# Patient Record
Sex: Male | Born: 1975 | Race: White | Hispanic: No | Marital: Married
Health system: Southern US, Community
[De-identification: ages and names within clinical notes are randomized; demographics above are authoritative.]

---

## 2001-08-04 ENCOUNTER — Emergency Department (HOSPITAL_COMMUNITY): Admission: EM | Admit: 2001-08-04 | Discharge: 2001-08-04 | Payer: Self-pay

## 2007-04-21 ENCOUNTER — Emergency Department: Payer: Self-pay | Admitting: Emergency Medicine

## 2008-05-03 ENCOUNTER — Emergency Department: Payer: Self-pay | Admitting: Emergency Medicine

## 2020-02-22 ENCOUNTER — Other Ambulatory Visit: Payer: Self-pay

## 2020-02-22 ENCOUNTER — Encounter: Payer: Self-pay | Admitting: Emergency Medicine

## 2020-02-22 ENCOUNTER — Emergency Department
Admission: EM | Admit: 2020-02-22 | Discharge: 2020-02-22 | Disposition: A | Discharge status: Home / Self Care | Payer: Self-pay | Attending: Emergency Medicine | Admitting: Emergency Medicine

## 2020-02-22 DIAGNOSIS — Y9289 Other specified places as the place of occurrence of the external cause: Secondary | ICD-10-CM | POA: Insufficient documentation

## 2020-02-22 DIAGNOSIS — Y9389 Activity, other specified: Secondary | ICD-10-CM | POA: Insufficient documentation

## 2020-02-22 DIAGNOSIS — K047 Periapical abscess without sinus: Secondary | ICD-10-CM | POA: Insufficient documentation

## 2020-02-22 DIAGNOSIS — S025XXA Fracture of tooth (traumatic), initial encounter for closed fracture: Secondary | ICD-10-CM | POA: Insufficient documentation

## 2020-02-22 DIAGNOSIS — K029 Dental caries, unspecified: Secondary | ICD-10-CM | POA: Insufficient documentation

## 2020-02-22 DIAGNOSIS — Y998 Other external cause status: Secondary | ICD-10-CM | POA: Insufficient documentation

## 2020-02-22 MED ORDER — LIDOCAINE VISCOUS HCL 2 % MT SOLN: 10.0000 mL | OROMUCOSAL | 0 refills | Status: DC | PRN

## 2020-02-22 MED ORDER — AMOXICILLIN-POT CLAVULANATE 875-125 MG PO TABS
1.0000 | ORAL_TABLET | Freq: Once | ORAL | Status: AC
  Administered 2020-02-22: 1 via ORAL
  Filled 2020-02-22: qty 1

## 2020-02-22 MED ORDER — HYDROCODONE-ACETAMINOPHEN 5-325 MG PO TABS
1.0000 | ORAL_TABLET | Freq: Once | ORAL | Status: AC
  Administered 2020-02-22: 1 via ORAL
  Filled 2020-02-22: qty 1

## 2020-02-22 MED ORDER — HYDROCODONE-ACETAMINOPHEN 5-325 MG PO TABS: 1.0000 | ORAL_TABLET | ORAL | 0 refills | Status: DC | PRN

## 2020-02-22 MED ORDER — AMOXICILLIN-POT CLAVULANATE 875-125 MG PO TABS: 1.0000 | ORAL_TABLET | Freq: Two times a day (BID) | ORAL | 0 refills | Status: DC

## 2020-02-22 NOTE — Discharge Instructions (Signed)
OPTIONS FOR DENTAL FOLLOW UP CARE ° °[place] Department of Health and Human Services - Local Safety Net Dental Clinics °http://www.ncdhhs.gov/dph/oralhealth/services/safetynetclinics.htm °  °[facility] (336-562-3123) ° °[facility] [place] ([phone]) ° °[facility] ([phone] ext [phone]) ° °[facility] ([phone]) ° °[facility] ([phone]) °This clinic caters to the indigent population and is on a lottery system. °Location: °[facility], [address], [place] °Clinic Hours: °Wednesdays from 6pm - 9pm, patients seen by a lottery system. °For dates, call or go to [email or web address].[facility].edu/[facility]/patients/Dental-[facility] °Services: °Cleanings, fillings and simple extractions. °Payment Options: °DENTAL WORK IS FREE OF CHARGE. Bring proof of income or support. °Best way to get seen: °Arrive at 5:15 pm - this is a lottery, NOT first come/first serve, so arriving earlier will not increase your chances of being seen. °  °  °[facility] °[phone] °Select option 1 for emergencies °  °Location: °[facility], [address], [place] °Clinic Hours: °No walk-ins accepted - call the day before to schedule an appointment. °Check in times are 9:30 am and 1:30 pm. °Services: °Simple extractions, temporary fillings, pulpectomy/pulp debridement, uncomplicated abscess drainage. °Payment Options: °PAYMENT IS DUE AT THE TIME OF SERVICE.  Fee is usually $100-200, additional surgical procedures (e.g. abscess drainage) may be extra. °Cash, checks, Visa/MasterCard accepted.  Can file Medicaid if patient is covered for dental - patient should call case worker to check. °No discount for [facility] patients. °Best way to get seen: °MUST call the day before and get onto the schedule. Can usually be seen the next 1-2 days. No walk-ins accepted. °  °  °[facility] °[phone] °   °Location: °[facility], [address], [place] °Clinic Hours: °M, W, Th, F 8am or 1:30pm, Tues 9a or 1:30 - first come/first served. °Services: °Simple extractions, temporary fillings, uncomplicated abscess drainage.  You do not need to be an [place] resident. °Payment Options: °PAYMENT IS DUE AT THE TIME OF SERVICE. °Dental insurance, otherwise sliding scale - bring proof of income or support. °Depending on income and treatment needed, cost is usually $50-200. °Best way to get seen: °Arrive early as it is first come/first served. °  °  °[facility] °[phone] °  °Location: °[address] °Clinic Hours: °Mon-Thu 8a-5p °Services: °Most basic dental services including extractions and fillings. °Payment Options: °PAYMENT IS DUE AT THE TIME OF SERVICE. °Sliding scale, up to 50% off - bring proof if income or support. °Medicaid with dental option accepted. °Best way to get seen: °Call to schedule an appointment, can usually be seen within 2 weeks OR they will try to see walk-ins - show up at 8a or 2p (you may have to wait). °  °  °[facility] °[phone] °[place] RESIDENTS ONLY °  °Location: °[facility], [address], [place], [place] [address] °Clinic Hours: By appointment only. °Monday - Thursday 8am-5pm, Friday 8am-12pm °Services: Cleanings, fillings, extractions. °Payment Options: °PAYMENT IS DUE AT THE TIME OF SERVICE. °Cash, Visa or MasterCard. Sliding scale - $30 minimum per service. °Best way to get seen: °Come in to office, complete packet and make an appointment - need proof of income °or support monies for each household member and proof of [place] residence. °Usually takes about a month to get in. °  °  °[facility] °[phone] °  °Location: °[address]   [place] °Clinic Hours: Walk-in Urgent Care Dental Services are offered Monday-Friday  mornings only. °The numbers of emergencies accepted daily is limited to the number of °providers available. °Maximum 15 - Mondays, Wednesdays & Thursdays °Maximum 10 - Tuesdays & Fridays °Services: °You do not need to be a [place] resident to be seen for a dental emergency. °Emergencies are defined as pain, swelling, abnormal bleeding, or dental trauma. Walkins will receive x-rays if needed. °NOTE: Dental cleaning is not an emergency. °Payment Options: °PAYMENT IS DUE AT THE TIME OF SERVICE. °Minimum co-pay is $40.00 for uninsured patients. °Minimum co-pay is $3.00 for Medicaid with dental coverage. °Dental Insurance is accepted and must be presented at time of visit. °Medicare does not cover dental. °Forms of payment: Cash, credit card, checks. °Best way to get seen: °If not previously registered with the clinic, walk-in dental registration begins at 7:15 am and is on a first come/first serve basis. °If previously registered with the clinic, call to make an appointment. °  °  °[facility] °[phone] °[place] RESIDENTS ONLY °  °Location: °[address], [place], [place] °Clinic Hours: °Mon-Thu 10a-2p °Services: Extractions only! °Payment Options: °FREE (donations accepted) - bring proof of income or support °Best way to get seen: °Call and schedule an appointment OR come at 8am on the 1st Monday of every month (except for holidays) when it is first come/first served. °  °  °[facility] °[phone] °  °Location: °[address], [place] °Clinic Hours: °Friday mornings °Services, Payment Options, Best way to get seen: °Call for info °
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
## 2020-02-22 NOTE — ED Notes (Signed)
Patient declined discharge vital signs. 

## 2020-02-22 NOTE — ED Triage Notes (Signed)
Pt reports toothache to right lower jaw for the past 2 days.

## 2020-02-22 NOTE — ED Triage Notes (Signed)
Pt cursing at staff in the [facility]. [employer] and security called out front to be present. Visitor restrictions explained to the pt, pt states, "I don't give a fuck, do something". RN apologetic to pt visitor. He continues to curse.

## 2020-11-15 ENCOUNTER — Other Ambulatory Visit: Payer: Self-pay

## 2020-11-15 ENCOUNTER — Encounter: Payer: Self-pay | Admitting: Emergency Medicine

## 2020-11-15 ENCOUNTER — Emergency Department: Payer: Self-pay

## 2020-11-15 ENCOUNTER — Emergency Department
Admission: EM | Admit: 2020-11-15 | Discharge: 2020-11-15 | Disposition: A | Discharge status: Home / Self Care | Payer: Self-pay | Attending: Emergency Medicine | Admitting: Emergency Medicine

## 2020-11-15 DIAGNOSIS — W450XXA Nail entering through skin, initial encounter: Secondary | ICD-10-CM | POA: Insufficient documentation

## 2020-11-15 DIAGNOSIS — Y9339 Activity, other involving climbing, rappelling and jumping off: Secondary | ICD-10-CM | POA: Insufficient documentation

## 2020-11-15 DIAGNOSIS — S51811A Laceration without foreign body of right forearm, initial encounter: Secondary | ICD-10-CM | POA: Insufficient documentation

## 2020-11-15 MED ORDER — OXYCODONE-ACETAMINOPHEN 5-325 MG PO TABS
1.0000 | ORAL_TABLET | Freq: Once | ORAL | Status: AC
  Administered 2020-11-15: 1 via ORAL
  Filled 2020-11-15: qty 1

## 2020-11-15 MED ORDER — BACITRACIN-NEOMYCIN-POLYMYXIN 400-5-5000 EX OINT
TOPICAL_OINTMENT | CUTANEOUS | Status: AC
  Filled 2020-11-15: qty 1

## 2020-11-15 MED ORDER — DOXYCYCLINE HYCLATE 100 MG PO CAPS: 100.0000 mg | ORAL_CAPSULE | Freq: Two times a day (BID) | ORAL | 0 refills | Status: AC

## 2020-11-15 MED ORDER — HYDROCODONE-ACETAMINOPHEN 5-325 MG PO TABS: 1.0000 | ORAL_TABLET | Freq: Four times a day (QID) | ORAL | 0 refills | Status: AC | PRN

## 2020-11-15 MED ORDER — LIDOCAINE-EPINEPHRINE 2 %-1:100000 IJ SOLN
30.0000 mL | Freq: Once | INTRAMUSCULAR | Status: DC
  Filled 2020-11-15: qty 2

## 2020-11-15 NOTE — ED Provider Notes (Signed)
[facility] Emergency Department Provider Note  ____________________________________________   Event Date/Time   First MD Initiated Contact with Patient 11/15/20 1339     (approximate)  I have reviewed the triage vital signs and the nursing notes.   HISTORY  Chief Complaint Laceration   HPI Brent Shea is a 45 y.o. male a past medical history of tobacco abuse who presents for assessment of a cut sustained to his right forearm last night.  Patient states he was getting off a deck when he excellently cut his forearm on an exposed nail.  Denies any other injuries states he applied superglue and some clots and a rope was able to get the bleeding stopped.  States his last tetanus shot was 2 years ago.  Denies any other pain including in the wrist, elbow or any associated sick symptoms including headache, earache, sore throat, fevers, chills, cough, nausea, vomiting, diarrhea, dysuria, rash or any other recent similar injuries.         History reviewed. No pertinent past medical history.  There are no problems to display for this patient.   History reviewed. No pertinent surgical history.  Prior to Admission medications   Medication Sig Start Date End Date Taking? Authorizing Provider  doxycycline (VIBRAMYCIN) 100 MG capsule Take 1 capsule (100 mg total) by mouth 2 (two) times daily for 7 days. 11/15/20 11/22/20 Yes Gilles Chiquito, MD  HYDROcodone-acetaminophen (NORCO) 5-325 MG tablet Take 1 tablet by mouth every 6 (six) hours as needed for up to 3 days for severe pain. 11/15/20 11/18/20 Yes Gilles Chiquito, MD    Allergies Patient has no known allergies.  No family history on file.  Social History    Review of Systems  Review of Systems  Constitutional: Negative for chills and fever.  HENT: Negative for sore throat.   Eyes: Negative for pain.  Respiratory: Negative for cough and stridor.   Cardiovascular: Negative for chest pain.   Gastrointestinal: Negative for vomiting.  Genitourinary: Negative for dysuria.  Musculoskeletal: Positive for myalgias ( R forearm).  Skin: Negative for rash.  Neurological: Negative for seizures, loss of consciousness and headaches.  Psychiatric/Behavioral: Negative for suicidal ideas.  All other systems reviewed and are negative.     ____________________________________________   PHYSICAL EXAM:  VITAL SIGNS: ED Triage Vitals  Enc Vitals Group     BP 11/15/20 1318 110/83     Pulse Rate 11/15/20 1318 84     Resp 11/15/20 1318 18     Temp 11/15/20 1318 98.4 F (36.9 C)     Temp Source 11/15/20 1318 Oral     SpO2 11/15/20 1318 97 %     Weight 11/15/20 1249 177 lb 14.6 oz (80.7 kg)     Height 11/15/20 1249 6\' 1"  (1.854 m)     Head Circumference --      Peak Flow --      Pain Score 11/15/20 1249 8     Pain Loc --      Pain Edu? --      Excl. in GC? --    Vitals:   11/15/20 1318  BP: 110/83  Pulse: 84  Resp: 18  Temp: 98.4 F (36.9 C)  SpO2: 97%   Physical Exam Vitals and nursing note reviewed.  Constitutional:      Appearance: He is well-developed.  HENT:     Head: Normocephalic and atraumatic.     Right Ear: External ear normal.     Left  Ear: External ear normal.     Nose: Nose normal.     Mouth/Throat:     Mouth: Mucous membranes are moist.  Eyes:     Conjunctiva/sclera: Conjunctivae normal.  Cardiovascular:     Rate and Rhythm: Normal rate and regular rhythm.     Heart sounds: No murmur heard.   Pulmonary:     Effort: Pulmonary effort is normal. No respiratory distress.     Breath sounds: Normal breath sounds.  Abdominal:     Palpations: Abdomen is soft.     Tenderness: There is no abdominal tenderness.  Musculoskeletal:     Cervical back: Neck supple.     Right lower leg: No edema.     Left lower leg: No edema.  Skin:    General: Skin is warm and dry.     Capillary Refill: Capillary refill takes less than 2 seconds.  Neurological:      Mental Status: He is alert and oriented to person, place, and time.  Psychiatric:        Mood and Affect: Mood normal.     Patient is at approximately V-shaped laceration over his right mid forearm.  There is underlying fascia and little tendon exposed.  Is able to flex and extend all digits in the right hand as well as the wrist.  Less than 2-second cap refill in all digits.  2+ radial pulse.  Sensation intact in the distribution of the radial ulnar and median nerves.  No other injuries noted to the hand palm wrist or upper arm. ____________________________________________   LABS (all labs ordered are listed, but only abnormal results are displayed)  Labs Reviewed - No data to display ____________________________________________  EKG  ____________________________________________  RADIOLOGY  ED MD interpretation: No fracture dislocation or retained foreign body.  Official radiology report(s): DG Forearm Right  Result Date: 11/15/2020 CLINICAL DATA:  Laceration due to nail EXAM: RIGHT FOREARM - 2 VIEW COMPARISON:  None. FINDINGS: Frontal and lateral views obtained. No radiopaque foreign body or soft tissue air. No fracture or dislocation. Joint spaces appear normal. There is a minus ulnar variance. IMPRESSION: No bony abnormality. No arthropathy. No radiopaque foreign body or soft tissue air. Minus ulnar variance. Electronically Signed   By: Bretta Bang III M.D.   On: 11/15/2020 14:25    ____________________________________________   PROCEDURES  Procedure(s) performed (including Critical Care):  Marland KitchenMarland KitchenLaceration Repair  Date/Time: 11/15/2020 2:31 PM Performed by: Gilles Chiquito, MD Authorized by: Gilles Chiquito, MD   Consent:    Consent obtained:  Verbal   Consent given by:  Patient   Risks, benefits, and alternatives were discussed: yes     Risks discussed:  Infection, pain, poor cosmetic result, need for additional repair, nerve damage, retained foreign body, tendon  damage and poor wound healing Universal protocol:    Procedure explained and questions answered to patient or proxy's satisfaction: yes     Patient identity confirmed:  Verbally with patient Laceration details:    Location:  Shoulder/arm   Shoulder/arm location:  R lower arm   Length (cm):  6 Exploration:    Limited defect created (wound extended): no     Hemostasis achieved with:  Direct pressure   Imaging obtained: x-ray     Imaging outcome: foreign body not noted     Wound exploration: wound explored through full range of motion     Wound extent: muscle damage     Contaminated: no   Treatment:    Area  cleansed with:  Saline   Amount of cleaning:  Standard   Irrigation solution:  Sterile water   Debridement:  None   Undermining:  None Skin repair:    Repair method:  Sutures   Suture size:  3-0   Suture material:  Prolene   Suture technique:  Simple interrupted Approximation:    Approximation:  Close Repair type:    Repair type:  Simple Post-procedure details:    Dressing:  Open (no dressing)   Procedure completion:  Tolerated well, no immediate complications     ____________________________________________   INITIAL IMPRESSION / ASSESSMENT AND PLAN / ED COURSE        Patient presents with above-stated history and exam for assessment of a laceration he sustained last night to his right forearm after he got caught on a nail jumping off a deck yesterday.  On arrival he is afebrile and hemodynamically stable.  He has a fairly large wound with exposed muscle and tendon.  However he is neurovascular intact distally.  No underlying fracture or radiopaque foreign body noted on plain film.  Wound was extensively irrigated.  Patient states his last tetanus was 2 years ago.  Wound repaired.  Will prescribe short course of antibiotics analgesia given wound on history seem quite dirty although did not look that bad in ED.  Advised patient to follow-up with PCP in 7 to 10 days for  wound recheck and suture removal.  Discharged stable condition.  Strict return precautions advised and discussed.       ____________________________________________   FINAL CLINICAL IMPRESSION(S) / ED DIAGNOSES  Final diagnoses:  Laceration of right forearm, initial encounter    Medications  lidocaine-EPINEPHrine (XYLOCAINE W/EPI) 2 %-1:100000 (with pres) injection 30 mL (has no administration in time range)  neomycin-bacitracin-polymyxin (NEOSPORIN) 400-10-4998 ointment packet (has no administration in time range)  oxyCODONE-acetaminophen (PERCOCET/ROXICET) 5-325 MG per tablet 1 tablet (1 tablet Oral Given 11/15/20 1354)     ED Discharge Orders         Ordered    doxycycline (VIBRAMYCIN) 100 MG capsule  2 times daily        11/15/20 1430    HYDROcodone-acetaminophen (NORCO) 5-325 MG tablet  Every 6 hours PRN        11/15/20 1430           Note:  This document was prepared using Dragon voice recognition software and may include unintentional dictation errors.   Gilles Chiquito, MD 11/15/20 1434

## 2020-11-15 NOTE — ED Notes (Signed)
Lidocaine given to EDP ?

## 2020-11-15 NOTE — ED Triage Notes (Addendum)
States was jumping off of a deck last night and 'ripped' right forearm with a nail.  C/O laceration to right forearm.  Patient states he used crazy glue, duct tape, and a rope last night to stop the bleeding.  States injury occurred at around 2200 last night.

## 2020-11-15 NOTE — ED Notes (Signed)
Neosporin and non stick dressing to left forarm stitches.

## 2020-11-15 NOTE — ED Notes (Signed)
See triage note  Presents with laceration to right arm  States she fell last pm  Caught it on a nail

## 2022-11-02 IMAGING — CR DG FOREARM 2V*R*
2 series · 2 of 2 positions shown · non-contrast
Comparison: None.

CLINICAL DATA: Laceration due to nail

EXAM:
RIGHT FOREARM - 2 VIEW

[forearm ap]
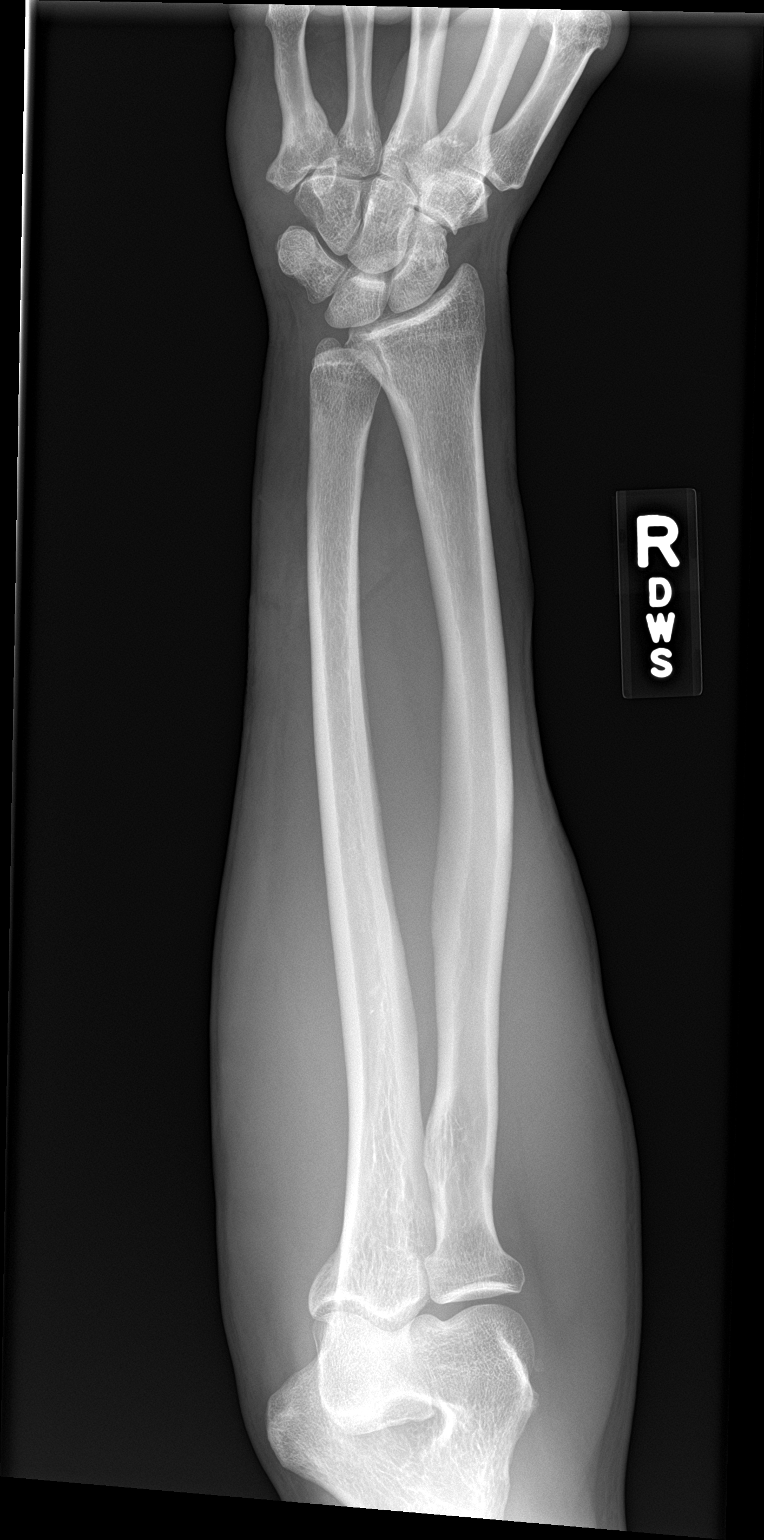

[forearm lat]
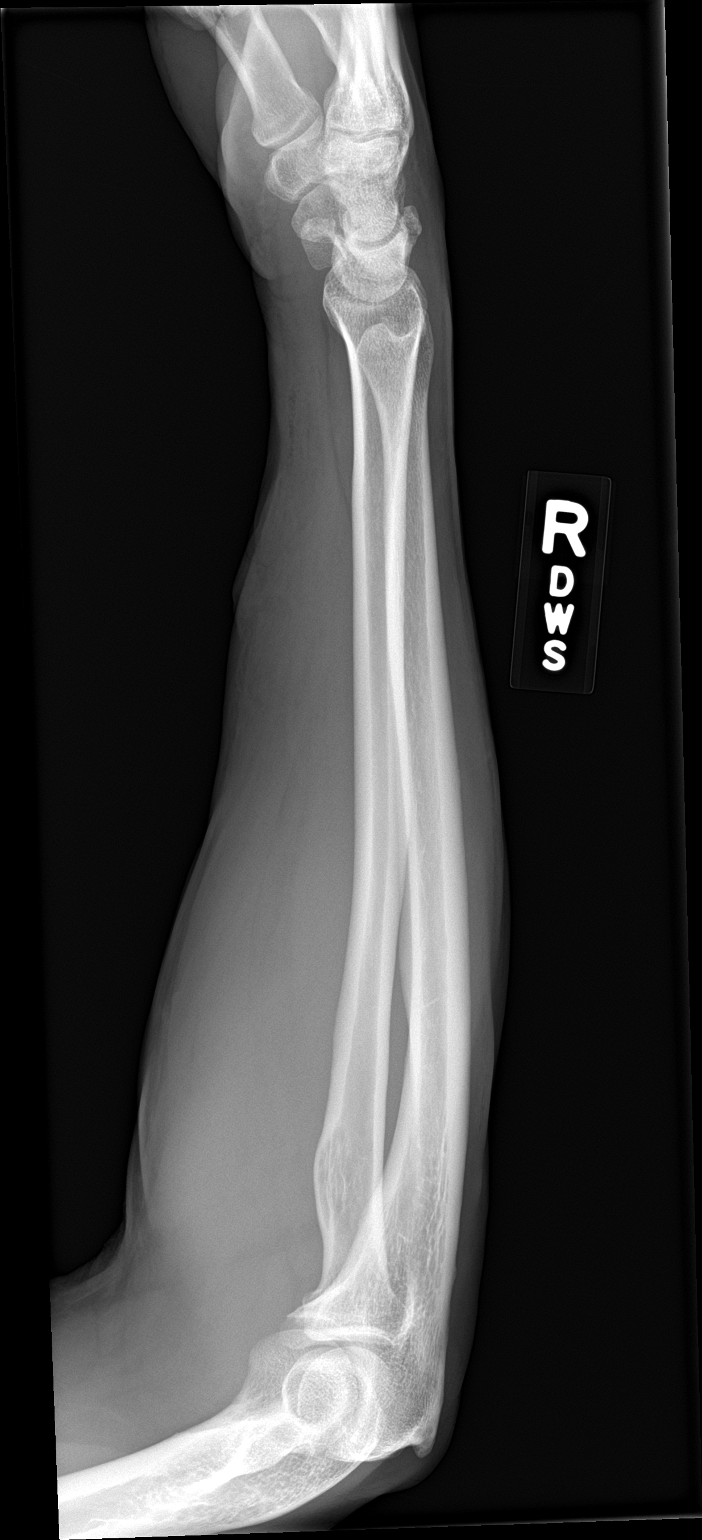

[2 of 2 positions shown; findings below may reference images not displayed]

FINDINGS: Frontal and lateral views obtained. No radiopaque foreign body or
soft tissue air. No fracture or dislocation. Joint spaces appear
normal. There is a minus ulnar variance.
IMPRESSION: No bony abnormality. No arthropathy. No radiopaque foreign body or
soft tissue air. Minus ulnar variance.
